# Patient Record
Sex: Male | Born: 1985 | Race: Black or African American | Hispanic: No | Marital: Single | Smoking: Current every day smoker
Health system: Southern US, Community
[De-identification: ages and names within clinical notes are randomized; demographics above are authoritative.]

---
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
## 2007-05-17 ENCOUNTER — Emergency Department (HOSPITAL_COMMUNITY): Admission: EM | Admit: 2007-05-17 | Discharge: 2007-05-17 | Payer: Self-pay | Admitting: Emergency Medicine

## 2007-09-22 ENCOUNTER — Emergency Department (HOSPITAL_COMMUNITY): Admission: EM | Admit: 2007-09-22 | Discharge: 2007-09-22 | Payer: Self-pay | Admitting: Emergency Medicine

## 2008-10-10 ENCOUNTER — Emergency Department (HOSPITAL_COMMUNITY): Admission: EM | Admit: 2008-10-10 | Discharge: 2008-10-11 | Payer: Self-pay | Admitting: Emergency Medicine

## 2009-12-26 ENCOUNTER — Emergency Department (HOSPITAL_COMMUNITY): Admission: EM | Admit: 2009-12-26 | Discharge: 2009-12-26 | Payer: Self-pay

## 2009-12-29 ENCOUNTER — Emergency Department (HOSPITAL_COMMUNITY): Admission: EM | Admit: 2009-12-29 | Discharge: 2009-12-29 | Payer: Self-pay | Admitting: Emergency Medicine

## 2011-04-11 DIAGNOSIS — Z0389 Encounter for observation for other suspected diseases and conditions ruled out: Secondary | ICD-10-CM | POA: Insufficient documentation

## 2011-04-12 ENCOUNTER — Encounter (HOSPITAL_COMMUNITY): Payer: Self-pay | Admitting: *Deleted

## 2011-04-12 ENCOUNTER — Emergency Department (HOSPITAL_COMMUNITY)
Admission: EM | Admit: 2011-04-12 | Discharge: 2011-04-12 | Discharge status: Against Medical Advice | Payer: Self-pay | Attending: Emergency Medicine | Admitting: Emergency Medicine

## 2011-04-12 ENCOUNTER — Emergency Department (HOSPITAL_COMMUNITY)
Admission: EM | Admit: 2011-04-12 | Discharge: 2011-04-12 | Disposition: A | Discharge status: Home / Self Care | Payer: Self-pay | Attending: Emergency Medicine | Admitting: Emergency Medicine

## 2011-04-12 DIAGNOSIS — L0231 Cutaneous abscess of buttock: Secondary | ICD-10-CM | POA: Insufficient documentation

## 2011-04-12 NOTE — ED Notes (Addendum)
Pt states that he has a boil on his rt buttocks. Pt states that he has had a boil in this area before.

## 2011-04-12 NOTE — ED Notes (Signed)
Boil cleaned and dressed

## 2011-04-12 NOTE — Discharge Instructions (Signed)
Please keep the area clean and dry. You will need to return in 2 days either to here or urgent care to get the packing pulled and to get the wound checked. If you are running a high fever or have any other worrisome symptoms, please return to the ER sooner.  RESOURCE GUIDE  Dental Problems  Patients with Medicaid: [facility] [phone] [address]                                           [phone] [address] Phone:  [phone]                                                  Phone:  [phone]  If unable to pay or uninsured, contact:  [facility] or [facility] to become qualified for the adult dental clinic.  Chronic Pain Problems Contact [facility]  [phone] Patients need to be referred by their primary care doctor.  Insufficient Money for Medicine Contact [employer]:  call "211" or [facility] [phone].  No Primary Care Doctor Call [facility]  ([phone] Other agencies that provide inexpensive medical care    [facility]  [phone]    [facility]  [phone]    [facility]  [phone]    [facility]  [phone]    [facility]  306-340-1092    [facility]  [phone]  Psychological Services [facility]  [phone] [facility]  [phone] [facility]   [phone] (emergency services [phone])  Substance Abuse Resources [facility]  (929)341-9642 [facility] ([phone] 047-7778 [facility] (808)560-7440 Floydene Flock (414)217-5271 Residential & Outpatient Substance Abuse Program  [phone]  Abuse/Neglect [facility] Child Abuse Hotline (985)173-4175 [facility] Child Abuse Hotline 2053723298 (After Hours)  Emergency Shelter [facility] 365-562-5998  Maternity Homes [facility] [place] [facility] [place] 479-455-9717 [facility]  Services (503) [phone]  MRSA Hotline #:   303-060-8373    [facility] Resources  [facility]     [employer]                          [facility] Dept. [address] [place]                       [address]      [address]  [place]                                                Cristobal Goldmann Phone:  [phone]  Phone:  [phone]                 Phone:  [phone]  [facility] Mental [facility] Phone:  [phone]  [facility] Child Abuse Hotline 5060951190 240-282-3823 (After Hours)  Abscess Care After An abscess (also called a boil or furuncle) is an infected area that contains a collection of pus. Signs and symptoms of an abscess include pain, tenderness, redness, or hardness, or you may feel a moveable soft area under your skin. An abscess can occur anywhere in the body. The infection may spread to surrounding tissues causing cellulitis. A cut (incision) by the surgeon was made over your abscess and the pus was drained out. Gauze may have been packed into the space to provide a drain that will allow the cavity to heal from the inside outwards. The boil may be painful for 5 to 7 days. Most people with a boil do not have high fevers. Your abscess, if seen early, may not have localized, and may not have been lanced. If not, another appointment may be required for this if it does not get better on its own or with medications. HOME CARE INSTRUCTIONS   Only take over-the-counter or prescription medicines for pain, discomfort, or fever as directed by your caregiver.   When you bathe, soak and then remove gauze or iodoform packs at least daily or as directed by your caregiver. You may then wash the wound gently with mild soapy water. Repack with gauze or do as your caregiver directs.  SEEK IMMEDIATE MEDICAL CARE IF:   You develop increased pain, swelling, redness,  drainage, or bleeding in the wound site.   You develop signs of generalized infection including muscle aches, chills, fever, or a general ill feeling.   An oral temperature above 102 F (38.9 C) develops, not controlled by medication.  See your caregiver for a recheck if you develop any of the symptoms described above. If medications (antibiotics) were prescribed, take them as directed. Document Released: 09/02/2004 Document Revised: 10/27/2010 Document Reviewed: 04/30/2007 [facility] Patient Information 2012 [place], [place].  Abscess An abscess (boil or furuncle) is an infected area that contains a collection of pus.  SYMPTOMS Signs and symptoms of an abscess include pain, tenderness, redness, or hardness. You may feel a moveable soft area under your skin. An abscess can occur anywhere in the body.  TREATMENT  A surgical cut (incision) may be made over your abscess to drain the pus. Gauze may be packed into the space or a drain may be looped through the abscess cavity (pocket). This provides a drain that will allow the cavity to heal from the inside outwards. The abscess may be painful for a few days, but should feel much better if it was drained.  Your abscess, if seen early, may not have localized and may not have been drained. If not, another appointment may be required if it does not get better on its own or with medications. HOME CARE INSTRUCTIONS   Only take over-the-counter or prescription medicines for pain, discomfort, or fever as directed by your caregiver.   Take your antibiotics as directed if they were prescribed. Finish them even if you start to feel better.   Keep the skin and clothes clean around your abscess.   If the abscess was drained, you will need to use gauze dressing to collect any draining pus. Dressings will typically need to be changed 3 or more times a day.   The infection may spread  by skin contact with others. Avoid skin contact as much as possible.    Practice good hygiene. This includes regular hand washing, cover any draining skin lesions, and do not share personal care items.   If you participate in sports, do not share athletic equipment, towels, whirlpools, or personal care items. Shower after every practice or tournament.   If a draining area cannot be adequately covered:   Do not participate in sports.   Children should not participate in day care until the wound has healed or drainage stops.   If your caregiver has given you a follow-up appointment, it is very important to keep that appointment. Not keeping the appointment could result in a much worse infection, chronic or permanent injury, pain, and disability. If there is any problem keeping the appointment, you must call back to this facility for assistance.  SEEK MEDICAL CARE IF:   You develop increased pain, swelling, redness, drainage, or bleeding in the wound site.   You develop signs of generalized infection including muscle aches, chills, fever, or a general ill feeling.   You have an oral temperature above 102 F (38.9 C).  MAKE SURE YOU:   Understand these instructions.   Will watch your condition.   Will get help right away if you are not doing well or get worse.  Document Released: 11/24/2004 Document Revised: 10/27/2010 Document Reviewed: 09/18/2007 [facility] Patient Information 2012 [place], [place].

## 2011-04-12 NOTE — ED Provider Notes (Signed)
History     CSN: [id number]  Arrival date & time 04/12/11  4098   First MD Initiated Contact with Patient 04/12/11 [phone]      Chief Complaint  Patient presents with  . Recurrent Skin Infections    (Consider location/radiation/quality/duration/timing/severity/associated sxs/prior treatment) HPI History is obtained from the patient. He presents with an abscess to his right buttock. This appeared approximately 3 days ago, and has increased in size. It has become increasingly painful. He has not noticed any drainage from the area. Denies fever or chills. States he has had an abscess in this area before which did not require incision and drainage.  History reviewed. No pertinent past medical history.  History reviewed. No pertinent past surgical history.  History reviewed. No pertinent family history.  History  Substance Use Topics  . Smoking status: Former Smoker    Types: Cigarettes  . Smokeless tobacco: Not on file  . Alcohol Use: Yes      Review of Systems  Constitutional: Negative for fever, chills, activity change and appetite change.  Gastrointestinal: Negative for nausea.  Genitourinary: Negative for scrotal swelling and testicular pain.  Skin: Positive for wound. Negative for color change.    Allergies  Tomato  Home Medications  No current outpatient prescriptions on file.  BP 100/68  Pulse 109  Temp(Src) 98 F (36.7 C) (Oral)  Resp 16  SpO2 100%  Physical Exam  Nursing note and vitals reviewed. Constitutional: He is oriented to person, place, and time. He appears well-developed and well-nourished. No distress.  HENT:  Head: Normocephalic and atraumatic.  Cardiovascular: Normal rate.   Pulmonary/Chest: Effort normal.  Neurological: He is alert and oriented to person, place, and time.  Skin: Skin is warm and dry. He is not diaphoretic.       Abscess measuring approximately 2.5 cm in diameter to the medial inferior right buttock. There is no drainage  noted from the area. The area is tender to palpation. No overlying cellulitis.  Psychiatric: He has a normal mood and affect.    ED Course  Procedures (including critical care time)  INCISION AND DRAINAGE Performed by: Grant Fontana Consent: Verbal consent obtained. Risks and benefits: risks, benefits and alternatives were discussed Type: abscess  Body area: R buttock  Anesthesia: local infiltration  Local anesthetic: lidocaine 2% with epinephrine  Anesthetic total: 3 ml  Complexity: complex Blunt dissection to break up loculations  Drainage: purulent  Drainage amount: large  Packing material: 1/4 in iodoform gauze  Patient tolerance: Patient tolerated the procedure well with no immediate complications.     Labs Reviewed - No data to display No results found.   1. Abscess of buttock, right       MDM  Patient presents with abscess of the buttock. This was incised and drained, which he tolerated well. VSS. Nontoxic appearing. The wound was packed, and he was advised to return for a wound recheck and to have the packing removed. Return precautions discussed.        Grant Fontana, [place] 04/12/11 [phone]

## 2011-04-13 NOTE — ED Provider Notes (Signed)
Medical screening examination/treatment/procedure(s) were performed by non-physician practitioner and as supervising physician I was immediately available for consultation/collaboration.    Avalene Sealy L Nakeysha Pasqual, MD 04/13/11 1047 

## 2011-11-14 ENCOUNTER — Emergency Department (HOSPITAL_COMMUNITY)
Admission: EM | Admit: 2011-11-14 | Discharge: 2011-11-14 | Disposition: A | Discharge status: Home / Self Care | Payer: Self-pay | Attending: Emergency Medicine | Admitting: Emergency Medicine

## 2011-11-14 ENCOUNTER — Emergency Department (HOSPITAL_COMMUNITY): Payer: Self-pay

## 2011-11-14 ENCOUNTER — Encounter (HOSPITAL_COMMUNITY): Payer: Self-pay | Admitting: *Deleted

## 2011-11-14 DIAGNOSIS — J40 Bronchitis, not specified as acute or chronic: Secondary | ICD-10-CM | POA: Insufficient documentation

## 2011-11-14 DIAGNOSIS — Z87891 Personal history of nicotine dependence: Secondary | ICD-10-CM | POA: Insufficient documentation

## 2011-11-14 MED ORDER — ALBUTEROL SULFATE HFA 108 (90 BASE) MCG/ACT IN AERS
2.0000 | INHALATION_SPRAY | Freq: Once | RESPIRATORY_TRACT | Status: AC
  Administered 2011-11-14: 2 via RESPIRATORY_TRACT
  Filled 2011-11-14: qty 6.7

## 2011-11-14 MED ORDER — PREDNISONE 10 MG PO TABS: 40.0000 mg | ORAL_TABLET | Freq: Every day | ORAL | Status: DC

## 2011-11-14 NOTE — ED Provider Notes (Signed)
History     CSN: [id number]  Arrival date & time 11/14/11  1527   First MD Initiated Contact with Patient 11/14/11 1731      Chief Complaint  Patient presents with  . URI    (Consider location/radiation/quality/duration/timing/severity/associated sxs/prior treatment) HPI Comments: Stephen Villanueva is a 26 y.o. Male who presents with complaint of URI symptoms. States symptoms for 2 weeks now. States sore throat, nasal congestion, cough. States cough is worsening, productive with thick sputum. States took robitussin when symptoms first started, but no improvement. Not taking any meds right now. States " i am worried I may have pneumonia."  Admits to fevers at home, does not know how high. Pt is a smoker. No other medical problems.    History reviewed. No pertinent past medical history.  History reviewed. No pertinent past surgical history.  No family history on file.  History  Substance Use Topics  . Smoking status: Former Smoker    Types: Cigarettes  . Smokeless tobacco: Not on file  . Alcohol Use: Yes      Review of Systems  Constitutional: Positive for fever and chills.  HENT: Positive for ear pain, congestion, sore throat and sinus pressure. Negative for neck pain and neck stiffness.   Respiratory: Positive for cough and shortness of breath. Negative for chest tightness.   Cardiovascular: Negative.   Gastrointestinal: Negative.   Musculoskeletal: Positive for myalgias.  Skin: Negative.   Neurological: Positive for weakness and headaches.    Allergies  Tomato  Home Medications  No current outpatient prescriptions on file.  BP 116/66  Pulse 68  Temp 98.3 F (36.8 C) (Oral)  Resp 16  SpO2 100%  Physical Exam  Nursing note and vitals reviewed. Constitutional: He appears well-developed and well-nourished. No distress.  HENT:  Head: Normocephalic and atraumatic.  Right Ear: External ear normal.  Left Ear: External ear normal.  Nose: Nose normal.    Mouth/Throat: Oropharynx is clear and moist. No oropharyngeal exudate.       TMs normal bilaterally  Eyes: Conjunctivae normal are normal.  Neck: Normal range of motion. Neck supple.  Cardiovascular: Normal rate, regular rhythm and normal heart sounds.   Pulmonary/Chest: Effort normal and breath sounds normal. No respiratory distress. He has no wheezes. He has no rales.  Abdominal: Soft. Bowel sounds are normal. He exhibits no distension. There is no tenderness. There is no rebound.  Neurological: He is alert.  Skin: Skin is warm and dry.  Psychiatric: He has a normal mood and affect.    ED Course  Procedures (including critical care time)  Pt does report fever, cough for 2 weeks now, shortness of breath, myalgias. WIll get CXR to r/o pneumonia. VS normal currently, afebrile.   Filed Vitals:   11/14/11 1638  BP: 116/66  Pulse: 68  Temp: 98.3 F (36.8 C)  Resp: 16    1. Bronchitis       MDM          Lottie Mussel, PA 11/15/11 [phone]

## 2011-11-14 NOTE — ED Notes (Signed)
Pt reports sinus drainage, productive cough, generalized weakness, and body aches for past two weeks. Pt took OTC cold medicine with no relief. Bilateral lung sounds CTA.

## 2011-11-16 NOTE — ED Provider Notes (Signed)
Medical screening examination/treatment/procedure(s) were performed by non-physician practitioner and as supervising physician I was immediately available for consultation/collaboration.  Cyndra Numbers, MD 11/16/11 2231

## 2012-01-29 ENCOUNTER — Emergency Department (HOSPITAL_COMMUNITY): Payer: Self-pay

## 2012-01-29 ENCOUNTER — Encounter (HOSPITAL_COMMUNITY): Payer: Self-pay | Admitting: *Deleted

## 2012-01-29 ENCOUNTER — Observation Stay (HOSPITAL_COMMUNITY)
Admission: EM | Admit: 2012-01-29 | Discharge: 2012-01-30 | Disposition: A | Discharge status: Home / Self Care | Payer: Self-pay | Attending: Emergency Medicine | Admitting: Emergency Medicine

## 2012-01-29 DIAGNOSIS — S32009A Unspecified fracture of unspecified lumbar vertebra, initial encounter for closed fracture: Secondary | ICD-10-CM

## 2012-01-29 DIAGNOSIS — M549 Dorsalgia, unspecified: Secondary | ICD-10-CM | POA: Insufficient documentation

## 2012-01-29 DIAGNOSIS — S36112A Contusion of liver, initial encounter: Secondary | ICD-10-CM | POA: Insufficient documentation

## 2012-01-29 DIAGNOSIS — R55 Syncope and collapse: Secondary | ICD-10-CM | POA: Insufficient documentation

## 2012-01-29 DIAGNOSIS — S022XXA Fracture of nasal bones, initial encounter for closed fracture: Secondary | ICD-10-CM

## 2012-01-29 DIAGNOSIS — T07XXXA Unspecified multiple injuries, initial encounter: Secondary | ICD-10-CM | POA: Insufficient documentation

## 2012-01-29 LAB — COMPREHENSIVE METABOLIC PANEL
Albumin: 4 g/dL (ref 3.5–5.2)
Alkaline Phosphatase: 76 U/L (ref 39–117)
CO2: 23 mEq/L (ref 19–32)
Creatinine, Ser: 0.99 mg/dL (ref 0.50–1.35)
GFR calc Af Amer: >90 mL/min (ref >90)
Sodium: 139 mEq/L (ref 135–145)
Total Bilirubin: 0.7 mg/dL (ref 0.3–1.2)
Total Protein: 7.1 g/dL (ref 6.0–8.3)

## 2012-01-29 LAB — CBC WITH DIFFERENTIAL/PLATELET
Basophils Relative: 0 % (ref 0–1)
Eosinophils Relative: 0 % (ref 0–5)
HCT: 43 % (ref 39.0–52.0)
Lymphocytes Relative: 14 % (ref 12–46)
Lymphs Abs: 2.3 10*3/uL (ref 0.7–4.0)
MCH: 29.9 pg (ref 26.0–34.0)
Monocytes Absolute: 1.4 10*3/uL — ABNORMAL HIGH (ref 0.1–1.0)
Neutro Abs: 12.7 10*3/uL — ABNORMAL HIGH (ref 1.7–7.7)
Neutrophils Relative %: 77 % (ref 43–77)

## 2012-01-29 LAB — URINALYSIS, ROUTINE W REFLEX MICROSCOPIC
Bilirubin Urine: NEGATIVE
Leukocytes, UA: NEGATIVE
Nitrite: NEGATIVE
Protein, ur: >300 mg/dL — AB
Specific Gravity, Urine: 1.025 (ref 1.005–1.030)
Urobilinogen, UA: 1 mg/dL (ref 0.0–1.0)

## 2012-01-29 LAB — URINE MICROSCOPIC-ADD ON

## 2012-01-29 MED ORDER — IOHEXOL 300 MG/ML  SOLN
100.0000 mL | Freq: Once | INTRAMUSCULAR | Status: AC | PRN
  Administered 2012-01-29: 100 mL via INTRAVENOUS

## 2012-01-29 MED ORDER — MORPHINE SULFATE 2 MG/ML IJ SOLN: 2.0000 mg | INTRAMUSCULAR | Status: DC | PRN

## 2012-01-29 MED ORDER — KCL IN DEXTROSE-NACL 20-5-0.45 MEQ/L-%-% IV SOLN
INTRAVENOUS | Status: DC
  Administered 2012-01-29 – 2012-01-30 (×2): via INTRAVENOUS
  Filled 2012-01-29 (×3): qty 1000

## 2012-01-29 MED ORDER — PROMETHAZINE HCL 25 MG/ML IJ SOLN
25.0000 mg | Freq: Once | INTRAMUSCULAR | Status: AC
  Administered 2012-01-29: 25 mg via INTRAVENOUS
  Filled 2012-01-29: qty 1

## 2012-01-29 MED ORDER — SODIUM CHLORIDE 0.9 % IV BOLUS (SEPSIS)
1000.0000 mL | Freq: Once | INTRAVENOUS | Status: AC
  Administered 2012-01-29: 1000 mL via INTRAVENOUS

## 2012-01-29 MED ORDER — HYDROCODONE-ACETAMINOPHEN 5-325 MG PO TABS
1.0000 | ORAL_TABLET | ORAL | Status: DC | PRN
  Administered 2012-01-29 – 2012-01-30 (×2): 2 via ORAL
  Filled 2012-01-29 (×2): qty 2

## 2012-01-29 MED ORDER — TETANUS-DIPHTH-ACELL PERTUSSIS 5-2.5-18.5 LF-MCG/0.5 IM SUSP
0.5000 mL | Freq: Once | INTRAMUSCULAR | Status: AC
  Administered 2012-01-29: 0.5 mL via INTRAMUSCULAR
  Filled 2012-01-29: qty 0.5

## 2012-01-29 MED ORDER — OXYCODONE-ACETAMINOPHEN 5-325 MG PO TABS
2.0000 | ORAL_TABLET | Freq: Once | ORAL | Status: AC
  Administered 2012-01-29: 2 via ORAL
  Filled 2012-01-29: qty 2

## 2012-01-29 MED ORDER — ONDANSETRON HCL 4 MG/2ML IJ SOLN: 4.0000 mg | Freq: Four times a day (QID) | INTRAMUSCULAR | Status: DC | PRN

## 2012-01-29 NOTE — ED Notes (Signed)
Called [facility] to transport pt. 

## 2012-01-29 NOTE — ED Notes (Signed)
Pt aware of the need for a urine sample, urinal at bedside. 

## 2012-01-29 NOTE — ED Notes (Signed)
Patient transported to CT 

## 2012-01-29 NOTE — ED Notes (Signed)
Patient transported to X-ray 
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
## 2012-01-29 NOTE — ED Notes (Addendum)
Pt still unable to provide [place] with a urine sample.

## 2012-01-30 DIAGNOSIS — S32009A Unspecified fracture of unspecified lumbar vertebra, initial encounter for closed fracture: Secondary | ICD-10-CM | POA: Diagnosis present

## 2012-01-30 DIAGNOSIS — S022XXA Fracture of nasal bones, initial encounter for closed fracture: Secondary | ICD-10-CM

## 2012-01-30 DIAGNOSIS — S36112A Contusion of liver, initial encounter: Secondary | ICD-10-CM | POA: Diagnosis present

## 2012-01-30 LAB — CBC
HCT: 39.4 % (ref 39.0–52.0)
Platelets: 160 10*3/uL (ref 150–400)
RBC: 4.53 MIL/uL (ref 4.22–5.81)
RDW: 13.8 % (ref 11.5–15.5)
WBC: 7.7 10*3/uL (ref 4.0–10.5)

## 2012-01-30 MED ORDER — HYDROCODONE-ACETAMINOPHEN 5-325 MG PO TABS: 1.0000 | ORAL_TABLET | ORAL | Status: DC | PRN

## 2012-01-30 NOTE — Discharge Summary (Signed)
It is fine for this patient to go home.  He is stable.  This patient has been seen and I agree with the findings and treatment plan.  Marta Lamas. Gae Bon, MD, FACS [phone] (pager) [phone] (direct pager) Trauma Surgeon
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
## 2012-01-30 NOTE — Progress Notes (Signed)
UR completed 
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
## 2015-04-29 ENCOUNTER — Encounter (HOSPITAL_COMMUNITY): Payer: Self-pay

## 2015-04-29 ENCOUNTER — Emergency Department (HOSPITAL_COMMUNITY)
Admission: EM | Admit: 2015-04-29 | Discharge: 2015-04-29 | Disposition: A | Discharge status: Home / Self Care | Payer: Self-pay | Attending: Emergency Medicine | Admitting: Emergency Medicine

## 2015-04-29 DIAGNOSIS — L723 Sebaceous cyst: Secondary | ICD-10-CM | POA: Insufficient documentation

## 2015-04-29 DIAGNOSIS — L02419 Cutaneous abscess of limb, unspecified: Secondary | ICD-10-CM

## 2015-04-29 DIAGNOSIS — L03116 Cellulitis of left lower limb: Secondary | ICD-10-CM | POA: Insufficient documentation

## 2015-04-29 DIAGNOSIS — F17210 Nicotine dependence, cigarettes, uncomplicated: Secondary | ICD-10-CM | POA: Insufficient documentation

## 2015-04-29 DIAGNOSIS — L02416 Cutaneous abscess of left lower limb: Secondary | ICD-10-CM | POA: Insufficient documentation

## 2015-04-29 DIAGNOSIS — L03119 Cellulitis of unspecified part of limb: Secondary | ICD-10-CM

## 2015-04-29 MED ORDER — SULFAMETHOXAZOLE-TRIMETHOPRIM 800-160 MG PO TABS: 1.0000 | ORAL_TABLET | Freq: Two times a day (BID) | ORAL | Status: DC

## 2015-04-29 NOTE — ED Notes (Signed)
Pt c/o abscess on L hip x 3 days.  Pain score 5/10.  Pt reports similar abscess on R leg that resolved w/ warm compresses.

## 2015-04-29 NOTE — ED Provider Notes (Signed)
CSN: [id number]     Arrival date & time 04/29/15  1435 History  By signing my name below, I, Placido Sou, attest that this documentation has been prepared under the direction and in the presence of Najeh Credit Camprubi-Soms, PA-C. Electronically Signed: Placido Sou, ED Scribe. 04/29/2015. 3:37 PM.    Chief Complaint  Patient presents with  . Abscess   Patient is a 30 y.o. male presenting with abscess. The history is provided by the patient. No language interpreter was used.  Abscess Location:  Leg Leg abscess location:  L upper leg Size:  1 cm Abscess quality: draining, induration, painful and redness   Abscess quality: no warmth   Red streaking: no   Duration:  3 days Progression:  Partially resolved Pain details:    Quality:  Aching and pressure   Severity:  Moderate   Duration:  3 days   Timing:  Intermittent   Progression:  Partially resolved Chronicity:  Recurrent Context: not diabetes, not immunosuppression, not injected drug use, not insect bite/sting and not skin injury   Relieved by:  Warm compresses and topical antibiotics Worsened by:  Draining/squeezing Ineffective treatments:  None tried Associated symptoms: no fever, no nausea and no vomiting   Risk factors: prior abscess    HPI Comments: Stephen Villanueva is a 30 y.o. male who is otherwise healthy presents to the Emergency Department complaining of a "boil" with pain and swelling to his left hip onset 3 days ago. He reports associated, intermittent, 5/10, aching pain in the affected region that radiates to his left hip region, worsens when ambulating and applying pressure, and improved with heat, antibiotic cream, and "boil-eeze". He also notes associated mild white and bloody drainage from the wound when he squeezed it, but states that the boil-eeze caused the boil to rupture spontaneously. Associated erythema. He notes a similar point of swelling to his right thigh that popped, drained and has cleared up on its own. He  denies a hx of IVDA or any injuries in the affected region, no known skin injuries, no insect bites. Pt denies a PMHx including DM or HIV. He denies warmth, red streaking, numbness, tingling, weakness, fevers, chills, CP, SOB, abd pain, n/v/d, dysuria, hematuria or any other associated symptoms at this time.    Pt also complains of a cyst to his right superior trapezius onset 1.5 years ago. He notes some associated intermittent mild, soreness and pain in the region that worsens with movement, which is chronic and unchanged. He was seen for it years ago and told it was a cyst, but has not followed up for it. Has no PCP at this time. Pt denies any other associated symptoms at this time.    History reviewed. No pertinent past medical history. History reviewed. No pertinent past surgical history. History reviewed. No pertinent family history. Social History  Substance Use Topics  . Smoking status: Current Every Day Smoker    Types: Cigarettes  . Smokeless tobacco: None  . Alcohol Use: Yes    Review of Systems  Constitutional: Negative for fever and chills.  Respiratory: Negative for shortness of breath.   Cardiovascular: Negative for chest pain.  Gastrointestinal: Negative for nausea, vomiting, abdominal pain, diarrhea and constipation.  Genitourinary: Negative for dysuria, hematuria and difficulty urinating.  Musculoskeletal: Positive for myalgias.  Skin: Positive for color change, rash (abscess) and wound.  Allergic/Immunologic: Negative for immunocompromised state.  Neurological: Negative for weakness and numbness.  Psychiatric/Behavioral: Negative for confusion.   A complete 10 system review  of systems was obtained and all systems are negative except as noted in the HPI and PMH.   Allergies  Strawberry extract and Tomato  Home Medications   Prior to Admission medications   Medication Sig Start Date End Date Taking? Authorizing Provider  HYDROcodone-acetaminophen (NORCO/VICODIN)  5-325 MG per tablet Take 1-2 tablets by mouth every 4 (four) hours as needed. 01/30/12   Nonie Hoyer, PA-C   BP 121/83 mmHg  Pulse 86  Temp(Src) 98.6 F (37 C) (Oral)  Resp 16  SpO2 100%    Physical Exam  Constitutional: He is oriented to person, place, and time. Vital signs are normal. He appears well-developed and well-nourished.  Non-toxic appearance. No distress.  Afebrile, nontoxic, NAD  HENT:  Head: Normocephalic and atraumatic.  Mouth/Throat: Mucous membranes are normal.  Eyes: Conjunctivae and EOM are normal. Right eye exhibits no discharge. Left eye exhibits no discharge.  Neck: Normal range of motion. Neck supple.  Cardiovascular: Normal rate and intact distal pulses.   Pulmonary/Chest: Effort normal. No respiratory distress.  Abdominal: Normal appearance. He exhibits no distension.  Musculoskeletal: Normal range of motion.  Left hip with abscess as noted below, with FROM intact and no focal bony tenderness, no crepitus or effusion, with strength and sensation grossly intact, distal pulses intact and soft compartments.   Neurological: He is alert and oriented to person, place, and time. He has normal strength. No sensory deficit.  Skin: Skin is warm and dry. No rash noted. There is erythema.  1 cm indurated abscess to the left lateral thigh with surrounding erythema and warmth, mild swelling, with a skin opening in the center of the abscess which is draining bloody material, with mild TTP to this area, no fluctuance.   R lateral neck with soft superficial sebaceous cyst, no opening or drainage, no warmth or erythema, no tenderness.   Psychiatric: He has a normal mood and affect.  Nursing note and vitals reviewed.   ED Course  Procedures  DIAGNOSTIC STUDIES: Oxygen Saturation is 100% on RA, normal by my interpretation.    COORDINATION OF CARE: 3:29 PM Discussed next steps with pt. He verbalized understanding and is agreeable with the plan.   Labs Review Labs  Reviewed - No data to display  Imaging Review No results found.   EKG Interpretation None      MDM   Final diagnoses:  Cellulitis and abscess of leg  Sebaceous cyst    30 y.o. male here with L hip abscess x3 days, mild surrounding cellulitic appearing, open and actively draining, indurated without fluctuance. Doubt need for I&D as the area is draining well and no further fluctuance. Leg with FROM intact and NVI with soft compartments. Discussed use of heat compresses, tylenol/motrin for pain, and start on bactrim. F/up with [facility] [facility] in 2-3 days for recheck of the area, and to establish care at [facility]. I explained the diagnosis and have given explicit precautions to return to the ER including for any other new or worsening symptoms. The patient understands and accepts the medical plan as it's been dictated and I have answered their questions. Discharge instructions concerning home care and prescriptions have been given. The patient is STABLE and is discharged to home in good condition.   I personally performed the services described in this documentation, which was scribed in my presence. The recorded information has been reviewed and is accurate.  BP 121/83 mmHg  Pulse 86  Temp(Src) 98.6 F (37 C) (Oral)  Resp  16  SpO2 100%  Meds ordered this encounter  Medications  . sulfamethoxazole-trimethoprim (BACTRIM DS,SEPTRA DS) 800-160 MG tablet    Sig: Take 1 tablet by mouth 2 (two) times daily.    Dispense:  14 tablet    Refill:  0    Order Specific Question:  Supervising Provider    Answer:  Eber Hong [[id number]]     Jameriah Trotti Camprubi-Soms, PA-C 04/29/15 1546  Lyndal Pulley, MD 04/30/15 [phone]

## 2015-04-29 NOTE — Discharge Instructions (Signed)
Keep wound clean and dry. Apply warm compresses to affected area throughout the day. Soak in epsom salt warm water baths for 20 minutes at least 3-4 times daily. Take antibiotic until it is finished. Take tylenol and motrin as needed for pain. Followup with [facility] [facility] wellness in 2 days for wound recheck, and to establish care. Monitor area for signs of infection to include, but not limited to: increasing pain, spreading redness, drainage/pus, worsening swelling, or fevers. Return to emergency department for emergent changing or worsening symptoms.    Abscess An abscess (boil or furuncle) is an infected area on or under the skin. This area is filled with yellowish-white fluid (pus) and other material (debris). HOME CARE   Only take medicines as told by your doctor.  If you were given antibiotic medicine, take it as directed. Finish the medicine even if you start to feel better.  If gauze is used, follow your doctor's directions for changing the gauze.  To avoid spreading the infection:  Keep your abscess covered with a bandage.  Wash your hands well.  Do not share personal care items, towels, or whirlpools with others.  Avoid skin contact with others.  Keep your skin and clothes clean around the abscess.  Keep all doctor visits as told. GET HELP RIGHT AWAY IF:   You have more pain, puffiness (swelling), or redness in the wound site.  You have more fluid or blood coming from the wound site.  You have muscle aches, chills, or you feel sick.  You have a fever. MAKE SURE YOU:   Understand these instructions.  Will watch your condition.  Will get help right away if you are not doing well or get worse.   This information is not intended to replace advice given to you by your health care provider. Make sure you discuss any questions you have with your health care provider.   Document Released: 08/03/2007 Document Revised: 08/16/2011 Document  Reviewed: 04/30/2011 Elsevier Interactive Patient Education 2016 Elsevier Inc.  Cellulitis Cellulitis is an infection of the skin and the tissue under the skin. The infected area is usually red and tender. This happens most often in the arms and lower legs. HOME CARE   Take your antibiotic medicine as told. Finish the medicine even if you start to feel better.  Keep the infected arm or leg raised (elevated).  Put a warm cloth on the area up to 4 times per day.  Only take medicines as told by your doctor.  Keep all doctor visits as told. GET HELP IF:  You see red streaks on the skin coming from the infected area.  Your red area gets bigger or turns a dark color.  Your bone or joint under the infected area is painful after the skin heals.  Your infection comes back in the same area or different area.  You have a puffy (swollen) bump in the infected area.  You have new symptoms.  You have a fever. GET HELP RIGHT AWAY IF:   You feel very sleepy.  You throw up (vomit) or have watery poop (diarrhea).  You feel sick and have muscle aches and pains.   This information is not intended to replace advice given to you by your health care provider. Make sure you discuss any questions you have with your health care provider.   Document Released: 08/03/2007 Document Revised: 11/05/2014 Document Reviewed: 05/02/2011 Elsevier Interactive Patient Education [employer]
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
## 2015-05-17 ENCOUNTER — Encounter (HOSPITAL_COMMUNITY): Payer: Self-pay | Admitting: Emergency Medicine

## 2015-05-17 ENCOUNTER — Emergency Department (HOSPITAL_COMMUNITY)
Admission: EM | Admit: 2015-05-17 | Discharge: 2015-05-17 | Disposition: A | Discharge status: Home / Self Care | Payer: Self-pay | Attending: Emergency Medicine | Admitting: Emergency Medicine

## 2015-05-17 DIAGNOSIS — R05 Cough: Secondary | ICD-10-CM | POA: Insufficient documentation

## 2015-05-17 DIAGNOSIS — J3489 Other specified disorders of nose and nasal sinuses: Secondary | ICD-10-CM | POA: Insufficient documentation

## 2015-05-17 DIAGNOSIS — R059 Cough, unspecified: Secondary | ICD-10-CM

## 2015-05-17 DIAGNOSIS — Z792 Long term (current) use of antibiotics: Secondary | ICD-10-CM | POA: Insufficient documentation

## 2015-05-17 NOTE — Discharge Instructions (Signed)

## 2015-05-17 NOTE — ED Provider Notes (Signed)
CSN: [id number]     Arrival date & time 05/17/15  1704 History  By signing my name below, I, Stephen Villanueva, attest that this documentation has been prepared under the direction and in the presence of Bear StearnsKayla Sakshi Sermons, PA-C. Electronically Signed: Evon Slackerrance Villanueva, ED Scribe. 05/17/2015. 7:47 PM.    Chief Complaint  Patient presents with  . Cough   Patient is a 30 y.o. male presenting with cough. The history is provided by the patient. No language interpreter was used.  Cough Associated symptoms: rhinorrhea and sore throat (resolved)   Associated symptoms: no chest pain, no chills, no ear pain, no fever, no headaches, no myalgias, no shortness of breath, no sinus congestion and no wheezing    HPI Comments: Stephen Villanueva is a 30 y.o. male who presents to the Emergency Department complaining of gradual onset, intermittent, improving productive cough of sputum onset 5 days prior. Pt reports associated rhinorrhea, congestion and improving sore throat. Pt states that he has tried mucinex and robitussin with relief. Denies fever, neck stiffness, ear pain, CP, SOB, HA, n/v or myalgias. Pt report that he smokes about 8 cigarettes per day.   History reviewed. No pertinent past medical history. History reviewed. No pertinent past surgical history. History reviewed. No pertinent family history. Social History  Substance Use Topics  . Smoking status: Current Every Day Smoker    Types: Cigarettes  . Smokeless tobacco: None  . Alcohol Use: Yes    Review of Systems  Constitutional: Negative for fever and chills.  HENT: Positive for congestion, rhinorrhea and sore throat (resolved). Negative for ear pain.   Respiratory: Positive for cough. Negative for shortness of breath and wheezing.   Cardiovascular: Negative for chest pain.  Gastrointestinal: Negative for nausea and vomiting.  Musculoskeletal: Negative for myalgias.  Neurological: Negative for headaches.  All other systems reviewed and are  negative.    Allergies  Strawberry extract and Tomato  Home Medications   Prior to Admission medications   Medication Sig Start Date End Date Taking? Authorizing Provider  HYDROcodone-acetaminophen (NORCO/VICODIN) 5-325 MG per tablet Take 1-2 tablets by mouth every 4 (four) hours as needed. 01/30/12   Nonie HoyerMegan N Baird, PA-C  sulfamethoxazole-trimethoprim (BACTRIM DS,SEPTRA DS) 800-160 MG tablet Take 1 tablet by mouth 2 (two) times daily. 04/29/15   Mercedes Camprubi-Soms, PA-C   BP 108/71 mmHg  Pulse 74  Temp(Src) 98.3 F (36.8 C) (Oral)  Resp 16  SpO2 96%   Physical Exam  Constitutional: He is oriented to person, place, and time. He appears well-developed and well-nourished.  Non-toxic appearance. He does not have a sickly appearance. He does not appear ill.  HENT:  Head: Normocephalic and atraumatic.  Right Ear: Tympanic membrane and external ear normal.  Left Ear: Tympanic membrane and external ear normal.  Nose: Nose normal.  Mouth/Throat: Uvula is midline, oropharynx is clear and moist and mucous membranes are normal. No oropharyngeal exudate, posterior oropharyngeal edema or posterior oropharyngeal erythema.  Eyes: Conjunctivae are normal. No scleral icterus.  Neck: Normal range of motion. Neck supple. No tracheal deviation present.  No nuchal rigidity.  Cardiovascular: Normal rate, regular rhythm and normal heart sounds.   No murmur heard. Pulmonary/Chest: Effort normal and breath sounds normal. No accessory muscle usage or stridor. No respiratory distress. He has no wheezes. He has no rhonchi. He has no rales.  Abdominal: Soft. Bowel sounds are normal. He exhibits no distension. There is no tenderness.  Musculoskeletal: Normal range of motion.  Lymphadenopathy:    He  has no cervical adenopathy.  Neurological: He is alert and oriented to person, place, and time.  Speech clear without dysarthria.  Skin: Skin is warm and dry.  Psychiatric: He has a normal mood and affect. His  behavior is normal.  Nursing note and vitals reviewed.   ED Course  Procedures (including critical care time) DIAGNOSTIC STUDIES: Oxygen Saturation is 96% on RA, adequate by my interpretation.    COORDINATION OF CARE: 7:45 PM-Discussed treatment plan with pt at bedside and pt agreed to plan.     Labs Review Labs Reviewed - No data to display  Imaging Review No results found.    EKG Interpretation None      MDM   Final diagnoses:  Cough   Patient presents with likely viral URI/bronchitis.  VSS, NAD.  No fever, neck stiffness, N/V, CP, or SOB.  Patient reports relief with mucinex and robitussin.  On exam, HENT exam unremarkable.  No nuchal rigidity. Heart RRR, lungs CTAB, abdomen soft and benign.  Offered CXR, and patient deferred.  He states that he just wanted to make sure it wasn't the flu.  I discussed that we could not rule out PNA, but given that his vitals are stable, no fever, myalgias, lungs CTAB, and he is improving, I have a very low suspicion for this.  Discussed follow up with [facility].  Discussed return precautions.  Patient agrees and acknowledges the above plan for discharge.    I personally performed the services described in this documentation, which was scribed in my presence. The recorded information has been reviewed and is accurate.      Cheri Fowler, PA-C 05/17/15 2001  Bethann Berkshire, MD 05/20/15 2003

## 2015-05-17 NOTE — ED Notes (Signed)
Pt states over the last week he's been having a productive cough and runny nose. Pt states he's tried OTC cough syrup and mucinex with improvement. Pt says he wanted to make sure it wasn't the flu.

## 2015-07-06 ENCOUNTER — Encounter (HOSPITAL_COMMUNITY): Payer: Self-pay | Admitting: Emergency Medicine

## 2015-07-06 ENCOUNTER — Emergency Department (HOSPITAL_COMMUNITY)
Admission: EM | Admit: 2015-07-06 | Discharge: 2015-07-06 | Disposition: A | Discharge status: Home / Self Care | Payer: Self-pay | Attending: Emergency Medicine | Admitting: Emergency Medicine

## 2015-07-06 DIAGNOSIS — F17210 Nicotine dependence, cigarettes, uncomplicated: Secondary | ICD-10-CM | POA: Insufficient documentation

## 2015-07-06 DIAGNOSIS — Z0289 Encounter for other administrative examinations: Secondary | ICD-10-CM | POA: Insufficient documentation

## 2015-07-06 DIAGNOSIS — R109 Unspecified abdominal pain: Secondary | ICD-10-CM | POA: Insufficient documentation

## 2015-07-06 DIAGNOSIS — R197 Diarrhea, unspecified: Secondary | ICD-10-CM | POA: Insufficient documentation

## 2015-07-06 DIAGNOSIS — Z792 Long term (current) use of antibiotics: Secondary | ICD-10-CM | POA: Insufficient documentation

## 2015-07-06 LAB — CBC
HCT: 46 % (ref 39.0–52.0)
Hemoglobin: 15.7 g/dL (ref 13.0–17.0)
MCH: 29.9 pg (ref 26.0–34.0)
MCHC: 34.1 g/dL (ref 30.0–36.0)
MCV: 87.6 fL (ref 78.0–100.0)
Platelets: 323 10*3/uL (ref 150–400)
RBC: 5.25 MIL/uL (ref 4.22–5.81)
RDW: 13.8 % (ref 11.5–15.5)
WBC: 9.3 10*3/uL (ref 4.0–10.5)

## 2015-07-06 LAB — COMPREHENSIVE METABOLIC PANEL
ALT: 13 U/L — ABNORMAL LOW (ref 17–63)
AST: 15 U/L (ref 15–41)
Albumin: 3.9 g/dL (ref 3.5–5.0)
Alkaline Phosphatase: 71 U/L (ref 38–126)
Anion gap: 8 (ref 5–15)
BUN: 9 mg/dL (ref 6–20)
CO2: 26 mmol/L (ref 22–32)
Calcium: 8.7 mg/dL — ABNORMAL LOW (ref 8.9–10.3)
Chloride: 101 mmol/L (ref 101–111)
Creatinine, Ser: 1.01 mg/dL (ref 0.61–1.24)
GFR calc Af Amer: >60 mL/min (ref >60)
GFR calc non Af Amer: >60 mL/min (ref >60)
Glucose, Bld: 102 mg/dL — ABNORMAL HIGH (ref 65–99)
Potassium: 3.8 mmol/L (ref 3.5–5.1)
Sodium: 135 mmol/L (ref 135–145)
Total Bilirubin: 1.7 mg/dL — ABNORMAL HIGH (ref 0.3–1.2)
Total Protein: 7.1 g/dL (ref 6.5–8.1)

## 2015-07-06 LAB — LIPASE, BLOOD: Lipase: 15 U/L (ref 11–51)

## 2015-07-06 MED ORDER — LACTINEX PO PACK: PACK | ORAL | Status: DC

## 2015-07-06 NOTE — ED Notes (Signed)
Pt states that he has had abdominal cramping and diarrhea x 4 days that has gotten better with OTC meds but he wants to get checked so he can go back to work. Alert and oriented.

## 2015-07-06 NOTE — ED Provider Notes (Signed)
CSN: [id number]     Arrival date & time 07/06/15  1526 History   First MD Initiated Contact with Patient 07/06/15 2058     Chief Complaint  Patient presents with  . Abdominal Pain     (Consider location/radiation/quality/duration/timing/severity/associated sxs/prior Treatment) HPI Comments: 30 year old male with no significant past medical history presents to the emergency department for evaluation of abdominal cramping and diarrhea. Symptoms began 3 days ago and have improved with the use of Pepto-Bismol and Tums. Patient states that his cramping, when present, was diffuse. He has had no episodes of diarrhea today and denies any blood in his stool previously. He has had no nausea or vomiting. No fevers. No history of abdominal surgeries. He states that he was around his sister who was sick with similar symptoms. He is requesting a work note so he can go back to work.  Patient is a 30 y.o. male presenting with abdominal pain. The history is provided by the patient. No language interpreter was used.  Abdominal Pain Associated symptoms: diarrhea   Associated symptoms: no dysuria, no fever and no hematuria     History reviewed. No pertinent past medical history. History reviewed. No pertinent past surgical history. History reviewed. No pertinent family history. Social History  Substance Use Topics  . Smoking status: Current Every Day Smoker    Types: Cigarettes  . Smokeless tobacco: None  . Alcohol Use: Yes    Review of Systems  Constitutional: Negative for fever.  Gastrointestinal: Positive for abdominal pain and diarrhea.  Genitourinary: Negative for dysuria and hematuria.  All other systems reviewed and are negative.   Allergies  Strawberry extract  Home Medications   Prior to Admission medications   Medication Sig Start Date End Date Taking? Authorizing Provider  HYDROcodone-acetaminophen (NORCO/VICODIN) 5-325 MG per tablet Take 1-2 tablets by mouth every 4 (four) hours as  needed. 01/30/12   Nonie Hoyer, PA-C  Lactobacillus (LACTINEX) PACK Mix half a packet with soft food and take twice a day for 5 days 07/06/15   Antony Madura, PA-C  sulfamethoxazole-trimethoprim (BACTRIM DS,SEPTRA DS) 800-160 MG tablet Take 1 tablet by mouth 2 (two) times daily. 04/29/15   Mercedes Camprubi-Soms, PA-C   BP 107/72 mmHg  Pulse 74  Temp(Src) 98 F (36.7 C) (Oral)  Resp 18  SpO2 100%   Physical Exam  Constitutional: He is oriented to person, place, and time. He appears well-developed and well-nourished. No distress.  Nontoxic/nonseptic appearing  HENT:  Head: Normocephalic and atraumatic.  Eyes: Conjunctivae and EOM are normal. No scleral icterus.  Neck: Normal range of motion.  Cardiovascular: Normal rate, regular rhythm and intact distal pulses.   Pulmonary/Chest: Effort normal and breath sounds normal. No respiratory distress. He has no wheezes. He has no rales.  Respirations even and unlabored  Abdominal: Soft. He exhibits no distension. There is no tenderness. There is no rebound and no guarding.  Soft, nontender abdomen. No masses or rigidity.  Musculoskeletal: Normal range of motion.  Neurological: He is alert and oriented to person, place, and time. He exhibits normal muscle tone. Coordination normal.  Patient ambulatory with steady gait  Skin: Skin is warm and dry. No rash noted. He is not diaphoretic. No erythema. No pallor.  Psychiatric: He has a normal mood and affect. His behavior is normal.  Nursing note and vitals reviewed.   ED Course  Procedures (including critical care time) Labs Review Labs Reviewed  COMPREHENSIVE METABOLIC PANEL - Abnormal; Notable for the following:  Glucose, Bld 102 (*)    Calcium 8.7 (*)    ALT 13 (*)    Total Bilirubin 1.7 (*)    All other components within normal limits  LIPASE, BLOOD  CBC    Imaging Review No results found.   I have personally reviewed and evaluated these images and lab results as part of my medical  decision-making.   EKG Interpretation None      MDM   Final diagnoses:  Diarrhea, unspecified type    30 year old male presents to the emergency department for a work note. He states that he needs a release to go back to work after he was out for symptoms of abdominal cramping and diarrhea which began 3 days ago. His symptoms have resolved with the use of over-the-counter medications. Patient is afebrile today with a reassuring laboratory workup. Abdomen is soft and nontender. No indication for further emergent testing. Work note provided for clearance. Return precautions given at discharge. Patient agreeable to plan with no unaddressed concerns; discharged in good condition.   Filed Vitals:   07/06/15 1543  BP: 107/72  Pulse: 74  Temp: 98 F (36.7 C)  TempSrc: Oral  Resp: 18  SpO2: 100%     Antony MaduraKelly Tymber Stallings, PA-C 07/06/15 2133  Raeford RazorStephen Kohut, MD 07/09/15 1301

## 2015-07-06 NOTE — ED Notes (Signed)
PT DISCHARGED. INSTRUCTIONS AND PRESCRIPTION GIVEN. AAOX3. PT IN NO APPARENT DISTRESS OR PAIN. THE OPPORTUNITY TO ASK QUESTIONS WAS PROVIDED. 

## 2015-07-06 NOTE — Discharge Instructions (Signed)

## 2016-04-13 ENCOUNTER — Encounter (HOSPITAL_COMMUNITY): Payer: Self-pay | Admitting: Emergency Medicine

## 2016-04-13 ENCOUNTER — Emergency Department (HOSPITAL_COMMUNITY)
Admission: EM | Admit: 2016-04-13 | Discharge: 2016-04-13 | Disposition: A | Discharge status: Home / Self Care | Payer: Self-pay | Attending: Physician Assistant | Admitting: Physician Assistant

## 2016-04-13 DIAGNOSIS — J069 Acute upper respiratory infection, unspecified: Secondary | ICD-10-CM | POA: Insufficient documentation

## 2016-04-13 DIAGNOSIS — F17210 Nicotine dependence, cigarettes, uncomplicated: Secondary | ICD-10-CM | POA: Insufficient documentation
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
## 2016-04-13 NOTE — ED Triage Notes (Signed)
Pt reports nasal congestion , cough and diarrhea x 3 days. Reports son been sick with similar symptoms. denies chest pain nor shortness of breath.

## 2016-06-06 ENCOUNTER — Ambulatory Visit (HOSPITAL_COMMUNITY)
Admission: EM | Admit: 2016-06-06 | Discharge: 2016-06-06 | Disposition: A | Discharge status: Home / Self Care | Payer: Self-pay | Attending: Family Medicine | Admitting: Family Medicine

## 2016-06-06 ENCOUNTER — Encounter (HOSPITAL_COMMUNITY): Payer: Self-pay | Admitting: Emergency Medicine

## 2016-06-06 DIAGNOSIS — R197 Diarrhea, unspecified: Secondary | ICD-10-CM

## 2016-06-06 DIAGNOSIS — R112 Nausea with vomiting, unspecified: Secondary | ICD-10-CM
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
## 2016-06-06 NOTE — ED Triage Notes (Signed)
The patient presented to the [facility] with a complaint of needing a doctor's note  For an absence from work yesterday for abdominal pain and diarrhea. The patient reported no symptoms today.

## 2017-04-30 ENCOUNTER — Emergency Department (HOSPITAL_COMMUNITY)
Admission: EM | Admit: 2017-04-30 | Discharge: 2017-04-30 | Disposition: A | Discharge status: Home / Self Care | Payer: Managed Care, Other (non HMO) | Attending: Emergency Medicine | Admitting: Emergency Medicine

## 2017-04-30 ENCOUNTER — Emergency Department (HOSPITAL_COMMUNITY): Payer: Managed Care, Other (non HMO)

## 2017-04-30 ENCOUNTER — Encounter (HOSPITAL_COMMUNITY): Payer: Self-pay | Admitting: *Deleted

## 2017-04-30 DIAGNOSIS — X509XXA Other and unspecified overexertion or strenuous movements or postures, initial encounter: Secondary | ICD-10-CM | POA: Insufficient documentation

## 2017-04-30 DIAGNOSIS — F17210 Nicotine dependence, cigarettes, uncomplicated: Secondary | ICD-10-CM | POA: Insufficient documentation

## 2017-04-30 DIAGNOSIS — Y929 Unspecified place or not applicable: Secondary | ICD-10-CM | POA: Diagnosis not present

## 2017-04-30 DIAGNOSIS — Y999 Unspecified external cause status: Secondary | ICD-10-CM | POA: Insufficient documentation

## 2017-04-30 DIAGNOSIS — S8991XA Unspecified injury of right lower leg, initial encounter: Secondary | ICD-10-CM | POA: Diagnosis present

## 2017-04-30 DIAGNOSIS — Y9302 Activity, running: Secondary | ICD-10-CM | POA: Insufficient documentation

## 2017-04-30 DIAGNOSIS — S82044A Nondisplaced comminuted fracture of right patella, initial encounter for closed fracture: Secondary | ICD-10-CM | POA: Diagnosis not present

## 2017-04-30 MED ORDER — HYDROCODONE-ACETAMINOPHEN 5-325 MG PO TABS: 1.0000 | ORAL_TABLET | Freq: Four times a day (QID) | ORAL | 0 refills | Status: AC | PRN

## 2017-04-30 MED ORDER — OXYCODONE-ACETAMINOPHEN 5-325 MG PO TABS
1.0000 | ORAL_TABLET | Freq: Once | ORAL | Status: AC
  Administered 2017-04-30: 1 via ORAL
  Filled 2017-04-30: qty 1

## 2017-04-30 NOTE — ED Triage Notes (Signed)
Pt complains of right knee pain radiating up and down his leg since running down a hill and twisting his right leg while running down a hill last night.
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
## 2017-04-30 NOTE — Discharge Instructions (Signed)
Please see the information and instructions below regarding your visit.  Your diagnoses today include:  1. Closed nondisplaced comminuted fracture of right patella, initial encounter    You have a fracture in your kneecap.  Tests performed today include: See side panel of your discharge paperwork for testing performed today. Vital signs are listed at the bottom of these instructions.   CT scan shows that the fracture is only affecting her kneecap.  Medications prescribed:    Take any prescribed medications only as prescribed, and any over the counter medications only as directed on the packaging.  You have been prescribed Norco for pain. This is an opioid pain medication. You may take this medication every 4-6 hours as needed for pain. Only take this medication if you need it for breakthrough pain. You may combine this medicine with ibuprofen, a non-steroidal anti-inflammatory drug (NSAID) every 6 hours, so you are getting something for pain relief every 3 hours.  Do not combine this medication with Tylenol, as it may increase the risk of liver problems.  Do not combine this medication with alcohol.  Please be advised to avoid driving or operating heavy machinery while taking this medication, as it may make you drowsy or impair judgment.    Home care instructions:  Please follow any educational materials contained in this packet.   Please elevate your knee when you are not up and using the crutches.  Please read all instructions on how to use a knee immobilizer in this paperwork.  Follow-up instructions: You will follow-up with Dr. Ophelia CharterYates of orthopedic surgery.  It is very important that you follow-up with him to maintain the proper care for your knee.  Return instructions:  Please return to the Emergency Department if you experience worsening symptoms.  Please return the emergency department for any increasing pain, loss of color or bluish color to your right lower leg, a cold  extremity, or increasing swelling down in your lower leg.  If any of this occurs, please first loosen up the knee immobilizer before presenting to the emergency department. Please return if you have any other emergent concerns.  Additional Information:   Your vital signs today were: BP 124/71 (BP Location: Right Arm)    Pulse 73    Temp 98.6 F (37 C) (Oral)    Resp 14    Ht 5\' 7"  (1.702 m)    Wt 56.7 kg (125 lb)    SpO2 100%    BMI 19.58 kg/m  If your blood pressure (BP) was elevated on multiple readings during this visit above 130 for the top number or above 80 for the bottom number, please have this repeated by your primary care provider within one month. --------------  Thank you for allowing us to participate in your care today.

## 2017-05-03 ENCOUNTER — Ambulatory Visit (INDEPENDENT_AMBULATORY_CARE_PROVIDER_SITE_OTHER): Payer: Managed Care, Other (non HMO) | Admitting: Orthopaedic Surgery

## 2017-05-03 ENCOUNTER — Encounter (INDEPENDENT_AMBULATORY_CARE_PROVIDER_SITE_OTHER): Payer: Self-pay | Admitting: Orthopaedic Surgery

## 2017-05-03 VITALS — BP 121/60 | HR 79 | Ht 67.0 in | Wt 125.0 lb

## 2017-05-03 DIAGNOSIS — S82091A Other fracture of right patella, initial encounter for closed fracture: Secondary | ICD-10-CM | POA: Diagnosis not present

## 2017-05-03 MED ORDER — HYDROCODONE-ACETAMINOPHEN 5-325 MG PO TABS
1.0000 | ORAL_TABLET | Freq: Three times a day (TID) | ORAL | 0 refills | Status: AC | PRN
Start: 2017-05-03 — End: ?
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
## 2017-05-04 ENCOUNTER — Inpatient Hospital Stay (INDEPENDENT_AMBULATORY_CARE_PROVIDER_SITE_OTHER): Payer: Managed Care, Other (non HMO) | Admitting: Surgery

## 2017-05-24 ENCOUNTER — Ambulatory Visit (INDEPENDENT_AMBULATORY_CARE_PROVIDER_SITE_OTHER): Payer: Managed Care, Other (non HMO) | Admitting: Orthopaedic Surgery

## 2022-08-16 ENCOUNTER — Other Ambulatory Visit: Payer: Self-pay

## 2022-08-16 ENCOUNTER — Emergency Department (HOSPITAL_BASED_OUTPATIENT_CLINIC_OR_DEPARTMENT_OTHER)
Admission: EM | Admit: 2022-08-16 | Discharge: 2022-08-16 | Disposition: A | Discharge status: Home / Self Care | Payer: Managed Care, Other (non HMO) | Attending: Emergency Medicine | Admitting: Emergency Medicine

## 2022-08-16 ENCOUNTER — Encounter (HOSPITAL_BASED_OUTPATIENT_CLINIC_OR_DEPARTMENT_OTHER): Payer: Self-pay

## 2022-08-16 DIAGNOSIS — L03011 Cellulitis of right finger: Secondary | ICD-10-CM

## 2022-08-16 DIAGNOSIS — F17200 Nicotine dependence, unspecified, uncomplicated: Secondary | ICD-10-CM | POA: Insufficient documentation

## 2022-08-16 MED ORDER — LIDOCAINE HCL 2 % IJ SOLN
10.0000 mL | Freq: Once | INTRAMUSCULAR | Status: AC
  Administered 2022-08-16: 200 mg
  Filled 2022-08-16: qty 20

## 2022-08-16 NOTE — ED Notes (Signed)
Pt's middle finger of rt. Hand, swelling and infection noted at nail bed Denies any injury

## 2022-08-16 NOTE — ED Provider Notes (Signed)
[facility] Provider Note: Lowella Dell, MD, FACEP  CSN: [id number] MRN: [id number] ARRIVAL: 08/16/22 at 0024 ROOM: [facility]   CHIEF COMPLAINT  Finger Swelling   HISTORY OF PRESENT ILLNESS  08/16/22 12:56 AM Stephen Villanueva is a 37 y.o. male with pain and swelling of his right middle finger for the past 4 days.  He denies trauma.  He rates the pain as a 7 out of 10.   History reviewed. No pertinent past medical history.  History reviewed. No pertinent surgical history.  History reviewed. No pertinent family history.  Social History   Tobacco Use   Smoking status: Every Day    Types: Cigarettes   Smokeless tobacco: Never  Substance Use Topics   Alcohol use: Yes   Drug use: No    Prior to Admission medications   Medication Sig Start Date End Date Taking? Authorizing Provider  HYDROcodone-acetaminophen (NORCO) 5-325 MG tablet Take 1 tablet by mouth 3 (three) times daily as needed for moderate pain. 05/03/17   Eldred Manges, MD  HYDROcodone-acetaminophen (NORCO/VICODIN) 5-325 MG tablet Take 1 tablet by mouth every 6 (six) hours as needed. 04/30/17   Aviva Kluver B, PA-C    Allergies Strawberry extract   REVIEW OF SYSTEMS  Negative except as noted here or in the History of Present Illness.   PHYSICAL EXAMINATION  Initial Vital Signs Blood pressure (!) 133/96, pulse 98, temperature 97.8 F (36.6 C), resp. rate 18, weight 54.4 kg, SpO2 100 %.  Examination General: Well-developed, well-nourished male in no acute distress; appearance consistent with age of record HENT: normocephalic; atraumatic Eyes: Normal appearance Neck: supple Heart: regular rate and rhythm Lungs: clear to auscultation bilaterally Abdomen: soft; nondistended; nontender; bowel sounds present Extremities: No deformity; full range of motion Neurologic: Awake, alert and oriented; motor function intact in all extremities and symmetric; no facial droop Skin: Warm and dry; tenderness and  swelling on the thenar side of the right third fingernail with pus surrounding the nail primarily proximally Psychiatric: Normal mood and affect   RESULTS  Summary of this visit's results, reviewed and interpreted by myself:   EKG Interpretation  Date/Time:    Ventricular Rate:    PR Interval:    QRS Duration:   QT Interval:    QTC Calculation:   R Axis:     Text Interpretation:         Laboratory Studies: No results found for this or any previous visit (from the past 24 hour(s)). Imaging Studies: No results found.  ED COURSE and MDM  Nursing notes, initial and subsequent vitals signs, including pulse oximetry, reviewed and interpreted by myself.  Vitals:   08/16/22 0029 08/16/22 0029  BP:  (!) 133/96  Pulse:  98  Resp:  18  Temp:  97.8 F (36.6 C)  SpO2:  100%  Weight: 54.4 kg    Medications  lidocaine (XYLOCAINE) 2 % (with pres) injection 200 mg (200 mg Other Given 08/16/22 0111)    Presentation consistent with paronychia.  The paronychia was drained on the thenar side of the patient's right third fingernail as described below.  PROCEDURES  Procedures INCISION AND DRAINAGE Performed by: Carlisle Beers Chermaine Schnyder Consent: Verbal consent obtained. Risks and benefits: risks, benefits and alternatives were discussed Type: Paronychia  Body area: Right middle finger  Anesthesia: Hemidigital block  Incision was made with a scalpel.  Local anesthetic: lidocaine 2% without epinephrine  Anesthetic total: 2 ml  Complexity: complex Blunt dissection to break up loculations  Drainage: purulent  Drainage amount: Copious  Packing material: None  Patient tolerance: Patient tolerated the procedure well with no immediate complications.    ED DIAGNOSES     ICD-10-CM   1. Paronychia of right middle finger  L03.011          Kynleigh Artz, MD 08/16/22 [id number]

## 2022-08-16 NOTE — ED Triage Notes (Signed)
Pt arrives with c/o middle right finger swelling and pain that started about 4 days ago. Pt denies injury/trauma.

## 2022-08-16 NOTE — ED Notes (Signed)
Wound dressed. ?

## 2022-08-16 NOTE — ED Notes (Signed)
Pt tolerated procedure well. Dsg applied 

## 2023-08-11 ENCOUNTER — Emergency Department (HOSPITAL_BASED_OUTPATIENT_CLINIC_OR_DEPARTMENT_OTHER): Payer: Self-pay

## 2023-08-11 ENCOUNTER — Emergency Department (HOSPITAL_BASED_OUTPATIENT_CLINIC_OR_DEPARTMENT_OTHER)
Admission: EM | Admit: 2023-08-11 | Discharge: 2023-08-11 | Disposition: A | Discharge status: Home / Self Care | Payer: Self-pay | Attending: Emergency Medicine | Admitting: Emergency Medicine

## 2023-08-11 ENCOUNTER — Encounter (HOSPITAL_BASED_OUTPATIENT_CLINIC_OR_DEPARTMENT_OTHER): Payer: Self-pay | Admitting: Emergency Medicine

## 2023-08-11 ENCOUNTER — Other Ambulatory Visit: Payer: Self-pay

## 2023-08-11 ENCOUNTER — Other Ambulatory Visit (HOSPITAL_BASED_OUTPATIENT_CLINIC_OR_DEPARTMENT_OTHER): Payer: Self-pay

## 2023-08-11 DIAGNOSIS — W228XXA Striking against or struck by other objects, initial encounter: Secondary | ICD-10-CM | POA: Insufficient documentation

## 2023-08-11 DIAGNOSIS — S62323A Displaced fracture of shaft of third metacarpal bone, left hand, initial encounter for closed fracture: Secondary | ICD-10-CM | POA: Insufficient documentation

## 2023-08-11 MED ORDER — OXYCODONE-ACETAMINOPHEN 5-325 MG PO TABS
1.0000 | ORAL_TABLET | Freq: Four times a day (QID) | ORAL | 0 refills | Status: AC | PRN
  Filled 2023-08-11: qty 10, 3d supply, fill #0

## 2023-08-11 NOTE — ED Notes (Signed)
 Cannot discharge, registration in chart.
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
## 2023-08-11 NOTE — ED Notes (Signed)
 Pt also report that he has a broken bone in his nose and it is hard to sniff.

## 2023-08-11 NOTE — Discharge Instructions (Signed)
 You were seen in the emergency department today for concerns of hand pain.  Your x-ray does have some concerns for a possible fracture to what is called the metacarpal bone of your 3rd and 4th digits on your left hand.  For this reason, you are placed into a splint.  I would strongly recommend that you follow-up with an orthopedic surgeon, specifically hand surgeon for evaluation of this injury.  The splint should remain in place until you are medically cleared from orthopedic standpoint by the hand surgeon.  For any concerns of any new or worsening symptoms, return to the emergency department.  I did send a prescription for a short course of pain medicine to your pharmacy for continued pain control.  Please take this only for severe pain.  For mild to moderate pain, take Tylenol  or ibuprofen.

## 2023-08-11 NOTE — ED Provider Notes (Signed)
 [place] [facility] Provider Note   CSN: [id number] Arrival date & time: 08/11/23  1458     Patient presents with: Hand Pain   Stephen Villanueva is a 38 y.o. male.  Patient presents emergency department for concerns of hand pain.  He reports that he punched a window 4 days ago on Monday, seen in emergency department outside of network that evaluated patient and performed laceration repair for a lesion to the distal tip of the right left third digit.  Patient reports he is continue to have pain on the dorsum of the left hand extending towards the forearm.  Reports some difficulty making a fist due to pain in this area but can freely move the wrist without  impairment.   Hand Pain       Prior to Admission medications   Medication Sig Start Date End Date Taking? Authorizing Provider  oxyCODONE -acetaminophen  (PERCOCET/ROXICET) 5-325 MG tablet Take 1 tablet by mouth every 6 (six) hours as needed for severe pain (pain score 7-10). 08/11/23  Yes Yukiko Minnich A, PA-C  HYDROcodone -acetaminophen  (NORCO) 5-325 MG tablet Take 1 tablet by mouth 3 (three) times daily as needed for moderate pain. 05/03/17   Adah Acron, MD  HYDROcodone -acetaminophen  (NORCO/VICODIN) 5-325 MG tablet Take 1 tablet by mouth every 6 (six) hours as needed. 04/30/17   Arneta Lango B, PA-C    Allergies: Strawberry extract    Review of Systems  Musculoskeletal:        Wrist pain  All other systems reviewed and are negative.   Updated Vital Signs BP 109/79   Pulse 87   Temp 98.4 F (36.9 C)   Resp 15   Ht 5' 7 (1.702 m)   Wt 51.7 kg   SpO2 99%   BMI 17.85 kg/m   Physical Exam Vitals and nursing note reviewed.  Constitutional:      General: He is not in acute distress.    Appearance: He is well-developed.  HENT:     Head: Normocephalic and atraumatic.   Eyes:     Conjunctiva/sclera: Conjunctivae normal.    Cardiovascular:     Rate and Rhythm: Normal rate and regular  rhythm.     Heart sounds: No murmur heard. Pulmonary:     Effort: Pulmonary effort is normal. No respiratory distress.     Breath sounds: Normal breath sounds.  Abdominal:     Palpations: Abdomen is soft.     Tenderness: There is no abdominal tenderness.   Musculoskeletal:        General: Swelling, tenderness and signs of injury present. No deformity. Normal range of motion.     Cervical back: Neck supple.     Comments: TTP and swelling overlying the 3rd and 4th digits on the dorsum of the left hand.  Worsened pain when tried to make a fist.  Neurovascularly intact.   Skin:    General: Skin is warm and dry.     Capillary Refill: Capillary refill takes less than 2 seconds.   Neurological:     Mental Status: He is alert.   Psychiatric:        Mood and Affect: Mood normal.     (all labs ordered are listed, but only abnormal results are displayed) Labs Reviewed - No data to display  EKG: None  Radiology: DG Hand Complete Left Result Date: 08/11/2023 CLINICAL DATA:  swelling. EXAM: LEFT HAND - COMPLETE 3+ VIEW COMPARISON:  None Available. FINDINGS: There is mildly displaced  fracture of the proximal portion of the third (most likely) or fourth metacarpal, which is mainly seen on the lateral view with associated mild overlying soft tissue swelling. Consider correlation with physical examination for point tenderness and or dedicated AP, lateral and oblique views of the bone in question for better evaluation. No other acute fracture or dislocation. No aggressive osseous lesion. No significant arthritis of the imaged joints. No radiopaque foreign bodies. Soft tissues are within normal limits. IMPRESSION: *There is mildly displaced fracture of the proximal portion of the third or fourth metacarpal, which is mainly seen on the lateral view with associated mild overlying soft tissue swelling. Consider correlation with physical examination for point tenderness and/or dedicated AP, lateral and  oblique views of the bone in question for better evaluation. Electronically Signed   By: Beula Brunswick M.D.   On: 08/11/2023 16:00    Procedures   Medications Ordered in the ED - No data to display                                Medical Decision Making Amount and/or Complexity of Data Reviewed Radiology: ordered.   This patient presents to the ED for concern of hand pain.  Differential diagnosis includes wrist dislocation, metacarpal fracture, boxer's fracture, wound infection   Imaging Studies ordered:  I ordered imaging studies including x-ray of the left hand I independently visualized and interpreted imaging which showed there is mildly displaced fracture of the proximal portion of the third or fourth metacarpal, which is mainly seen on the lateral view with associated mild overlying soft tissue swelling.  I agree with the radiologist interpretation   Problem List / ED Course:  Patient presents the emergency department with concerns of hand pain.  Reportedly was seen at another emergency department several days ago for initial injury after he reportedly punched a glass window.  Endorses continued pain to the dorsal aspect of the left hand around the 3rd and 4th digit.  X-ray imaging obtained from triage for assessment of injury. On the evaluation exam, there is mild swelling but no significant erythema overlying the dorsum of the left hand.  Will correlate for possible injuries with x-ray.  Patient is able to move the wrist without significant impairment but movement of the fingers primarily with try to make a close fist results and worsening pain.  Neurovascular intact. X-ray shows concerns for a possible mild displaced fracture of the proximal portion of the 3rd or 4th metacarpal.  Given this finding, placed patient into a sugar-tong splint as involvement of both 3rd and 4th digits means that patient likely unable to safely be sent home with only a ulnar or radial gutter splint.   Advised patient that he would benefit from outpatient follow-up with hand surgery to ensure that he has appropriate healing course.  Righted patient with count information for Dr. Delmar Ferrara with hand surgery.  Return precautions discussed such as concerns for new or worsening symptoms.  Otherwise stable at this time for outpatient follow-up and discharged home.  A short prescription for Percocet quantity 10 has been sent to his pharmacy for management of severe pain at home.   Final diagnoses:  Closed displaced fracture of shaft of third metacarpal bone of left hand, initial encounter    ED Discharge Orders          Ordered    oxyCODONE -acetaminophen  (PERCOCET/ROXICET) 5-325 MG tablet  Every 6 hours PRN  08/11/23 1646               Concetta Dee, PA-C 08/11/23 1649    Sallyanne Creamer, DO 08/14/23 1135

## 2023-08-11 NOTE — ED Notes (Signed)
